# Patient Record
Sex: Female | Born: 1950 | Race: White | Hispanic: No | Marital: Married | Smoking: Former smoker
Health system: Southern US, Community
[De-identification: ages and names within clinical notes are randomized; demographics above are authoritative.]

## PROBLEM LIST (undated history)

## (undated) DIAGNOSIS — Z780 Asymptomatic menopausal state: Secondary | ICD-10-CM

## (undated) DIAGNOSIS — R51 Headache: Secondary | ICD-10-CM

## (undated) DIAGNOSIS — I10 Essential (primary) hypertension: Secondary | ICD-10-CM

## (undated) DIAGNOSIS — E785 Hyperlipidemia, unspecified: Secondary | ICD-10-CM

## (undated) HISTORY — PX: COLONOSCOPY W/ POLYPECTOMY: SHX1380

## (undated) HISTORY — DX: Essential (primary) hypertension: I10

## (undated) HISTORY — DX: Hyperlipidemia, unspecified: E78.5

## (undated) HISTORY — DX: Asymptomatic menopausal state: Z78.0

## (undated) HISTORY — PX: ABDOMINAL HYSTERECTOMY: SHX81

## (undated) HISTORY — PX: TUBAL LIGATION: SHX77

## (undated) HISTORY — DX: Headache: R51

---

## 1998-08-01 ENCOUNTER — Ambulatory Visit (HOSPITAL_COMMUNITY): Admission: RE | Admit: 1998-08-01 | Discharge: 1998-08-01 | Payer: Self-pay | Admitting: Obstetrics & Gynecology

## 1999-08-08 ENCOUNTER — Encounter: Payer: Self-pay | Admitting: Obstetrics & Gynecology

## 1999-08-08 ENCOUNTER — Ambulatory Visit (HOSPITAL_COMMUNITY): Admission: RE | Admit: 1999-08-08 | Discharge: 1999-08-08 | Payer: Self-pay | Admitting: Obstetrics & Gynecology

## 2001-10-25 ENCOUNTER — Other Ambulatory Visit: Admission: RE | Admit: 2001-10-25 | Discharge: 2001-10-25 | Payer: Self-pay | Admitting: Obstetrics & Gynecology

## 2002-11-28 ENCOUNTER — Other Ambulatory Visit: Admission: RE | Admit: 2002-11-28 | Discharge: 2002-11-28 | Payer: Self-pay | Admitting: Obstetrics & Gynecology

## 2003-01-31 ENCOUNTER — Encounter: Payer: Self-pay | Admitting: Internal Medicine

## 2003-01-31 ENCOUNTER — Encounter: Admission: RE | Admit: 2003-01-31 | Discharge: 2003-01-31 | Payer: Self-pay | Admitting: Internal Medicine

## 2003-11-30 ENCOUNTER — Other Ambulatory Visit: Admission: RE | Admit: 2003-11-30 | Discharge: 2003-11-30 | Payer: Self-pay | Admitting: Obstetrics & Gynecology

## 2004-12-24 ENCOUNTER — Other Ambulatory Visit: Admission: RE | Admit: 2004-12-24 | Discharge: 2004-12-24 | Payer: Self-pay | Admitting: Obstetrics & Gynecology

## 2004-12-26 ENCOUNTER — Ambulatory Visit: Payer: Self-pay | Admitting: Internal Medicine

## 2005-01-07 ENCOUNTER — Ambulatory Visit: Payer: Self-pay | Admitting: Internal Medicine

## 2005-03-03 ENCOUNTER — Ambulatory Visit: Payer: Self-pay | Admitting: Internal Medicine

## 2005-05-19 ENCOUNTER — Ambulatory Visit: Payer: Self-pay | Admitting: Internal Medicine

## 2005-07-18 ENCOUNTER — Ambulatory Visit: Payer: Self-pay | Admitting: Internal Medicine

## 2005-07-25 ENCOUNTER — Ambulatory Visit: Payer: Self-pay | Admitting: Internal Medicine

## 2006-01-20 ENCOUNTER — Other Ambulatory Visit: Admission: RE | Admit: 2006-01-20 | Discharge: 2006-01-20 | Payer: Self-pay | Admitting: Obstetrics & Gynecology

## 2006-02-17 ENCOUNTER — Ambulatory Visit: Payer: Self-pay | Admitting: Internal Medicine

## 2006-02-24 ENCOUNTER — Ambulatory Visit: Payer: Self-pay | Admitting: Internal Medicine

## 2006-08-11 ENCOUNTER — Ambulatory Visit: Payer: Self-pay | Admitting: Internal Medicine

## 2006-09-17 ENCOUNTER — Ambulatory Visit: Payer: Self-pay | Admitting: Internal Medicine

## 2006-11-26 ENCOUNTER — Ambulatory Visit: Payer: Self-pay | Admitting: Internal Medicine

## 2006-11-26 LAB — CONVERTED CEMR LAB
ALT: 11 units/L (ref 0–40)
AST: 14 units/L (ref 0–37)
Albumin: 3.9 g/dL (ref 3.5–5.2)
Alkaline Phosphatase: 55 units/L (ref 39–117)
BUN: 16 mg/dL (ref 6–23)
Basophils Absolute: 0 10*3/uL (ref 0.0–0.1)
Basophils Relative: 0.5 % (ref 0.0–1.0)
Bilirubin, Direct: 0.1 mg/dL (ref 0.0–0.3)
CO2: 30 meq/L (ref 19–32)
Calcium: 9.7 mg/dL (ref 8.4–10.5)
Chloride: 102 meq/L (ref 96–112)
Cholesterol: 189 mg/dL (ref 0–200)
Creatinine, Ser: 0.9 mg/dL (ref 0.4–1.2)
Eosinophils Absolute: 0.1 10*3/uL (ref 0.0–0.6)
Eosinophils Relative: 2 % (ref 0.0–5.0)
GFR calc Af Amer: 83 mL/min
GFR calc non Af Amer: 69 mL/min
Glucose, Bld: 106 mg/dL — ABNORMAL HIGH (ref 70–99)
HCT: 38.5 % (ref 36.0–46.0)
HDL: 63.5 mg/dL (ref >39.0)
Hemoglobin: 13.4 g/dL (ref 12.0–15.0)
LDL Cholesterol: 91 mg/dL (ref 0–99)
Lymphocytes Relative: 20.8 % (ref 12.0–46.0)
MCHC: 34.9 g/dL (ref 30.0–36.0)
MCV: 86 fL (ref 78.0–100.0)
Monocytes Absolute: 0.4 10*3/uL (ref 0.2–0.7)
Monocytes Relative: 6.4 % (ref 3.0–11.0)
Neutro Abs: 4.6 10*3/uL (ref 1.4–7.7)
Neutrophils Relative %: 70.3 % (ref 43.0–77.0)
Platelets: 228 10*3/uL (ref 150–400)
Potassium: 4.4 meq/L (ref 3.5–5.1)
RBC: 4.48 M/uL (ref 3.87–5.11)
RDW: 12.2 % (ref 11.5–14.6)
Sodium: 140 meq/L (ref 135–145)
TSH: 1.7 microintl units/mL (ref 0.35–5.50)
Total Bilirubin: 0.9 mg/dL (ref 0.3–1.2)
Total CHOL/HDL Ratio: 3
Total Protein: 6.5 g/dL (ref 6.0–8.3)
Triglycerides: 175 mg/dL — ABNORMAL HIGH (ref 0–149)
VLDL: 35 mg/dL (ref 0–40)
WBC: 6.4 10*3/uL (ref 4.5–10.5)

## 2006-12-03 ENCOUNTER — Ambulatory Visit: Payer: Self-pay | Admitting: Internal Medicine

## 2007-07-23 DIAGNOSIS — I10 Essential (primary) hypertension: Secondary | ICD-10-CM | POA: Insufficient documentation

## 2007-07-23 DIAGNOSIS — E785 Hyperlipidemia, unspecified: Secondary | ICD-10-CM

## 2007-09-20 ENCOUNTER — Ambulatory Visit: Payer: Self-pay | Admitting: Internal Medicine

## 2007-10-04 ENCOUNTER — Encounter: Payer: Self-pay | Admitting: Internal Medicine

## 2007-10-04 ENCOUNTER — Ambulatory Visit: Payer: Self-pay | Admitting: Internal Medicine

## 2007-10-25 ENCOUNTER — Ambulatory Visit: Payer: Self-pay | Admitting: Internal Medicine

## 2007-10-25 DIAGNOSIS — N951 Menopausal and female climacteric states: Secondary | ICD-10-CM

## 2008-01-25 ENCOUNTER — Ambulatory Visit: Payer: Self-pay | Admitting: Internal Medicine

## 2008-01-25 DIAGNOSIS — N3000 Acute cystitis without hematuria: Secondary | ICD-10-CM | POA: Insufficient documentation

## 2008-01-25 LAB — CONVERTED CEMR LAB
Cholesterol, target level: 200 mg/dL
HDL goal, serum: 40 mg/dL
LDL Goal: 160 mg/dL

## 2008-03-13 ENCOUNTER — Ambulatory Visit: Payer: Self-pay | Admitting: Internal Medicine

## 2008-03-13 LAB — CONVERTED CEMR LAB
AST: 16 units/L (ref 0–37)
Albumin: 3.9 g/dL (ref 3.5–5.2)
BUN: 16 mg/dL (ref 6–23)
Basophils Absolute: 0 10*3/uL (ref 0.0–0.1)
Basophils Relative: 0.6 % (ref 0.0–1.0)
Bilirubin Urine: NEGATIVE
Chloride: 102 meq/L (ref 96–112)
Creatinine, Ser: 0.9 mg/dL (ref 0.4–1.2)
Direct LDL: 109.3 mg/dL
Eosinophils Absolute: 0.2 10*3/uL (ref 0.0–0.7)
Eosinophils Relative: 2.8 % (ref 0.0–5.0)
GFR calc Af Amer: 83 mL/min
GFR calc non Af Amer: 69 mL/min
Glucose, Urine, Semiquant: NEGATIVE
HCT: 39.8 % (ref 36.0–46.0)
HDL: 59.6 mg/dL (ref >39.0)
Ketones, urine, test strip: NEGATIVE
MCHC: 34 g/dL (ref 30.0–36.0)
MCV: 86.8 fL (ref 78.0–100.0)
Monocytes Absolute: 0.4 10*3/uL (ref 0.1–1.0)
Neutrophils Relative %: 69.3 % (ref 43.0–77.0)
Nitrite: NEGATIVE
Platelets: 208 10*3/uL (ref 150–400)
Protein, U semiquant: NEGATIVE
RBC: 4.59 M/uL (ref 3.87–5.11)
Specific Gravity, Urine: 1.025
TSH: 1.07 microintl units/mL (ref 0.35–5.50)
Total Bilirubin: 0.7 mg/dL (ref 0.3–1.2)
Triglycerides: 144 mg/dL (ref 0–149)
Urobilinogen, UA: 0.2
WBC Urine, dipstick: NEGATIVE
pH: 5

## 2008-04-13 ENCOUNTER — Ambulatory Visit: Payer: Self-pay | Admitting: Internal Medicine

## 2008-04-13 LAB — CONVERTED CEMR LAB: LDL Goal: 130 mg/dL

## 2008-05-18 ENCOUNTER — Telehealth: Payer: Self-pay | Admitting: Internal Medicine

## 2008-07-17 ENCOUNTER — Ambulatory Visit: Payer: Self-pay | Admitting: Internal Medicine

## 2008-09-18 ENCOUNTER — Ambulatory Visit: Payer: Self-pay | Admitting: Internal Medicine

## 2008-09-21 ENCOUNTER — Encounter: Payer: Self-pay | Admitting: Internal Medicine

## 2008-09-26 ENCOUNTER — Telehealth: Payer: Self-pay | Admitting: *Deleted

## 2008-11-09 ENCOUNTER — Ambulatory Visit: Payer: Self-pay | Admitting: Internal Medicine

## 2008-11-09 DIAGNOSIS — R519 Headache, unspecified: Secondary | ICD-10-CM | POA: Insufficient documentation

## 2008-11-09 DIAGNOSIS — R51 Headache: Secondary | ICD-10-CM

## 2008-12-06 ENCOUNTER — Ambulatory Visit: Payer: Self-pay | Admitting: Internal Medicine

## 2008-12-08 ENCOUNTER — Ambulatory Visit: Payer: Self-pay | Admitting: Internal Medicine

## 2009-04-05 ENCOUNTER — Encounter: Payer: Self-pay | Admitting: Internal Medicine

## 2009-06-04 ENCOUNTER — Ambulatory Visit: Payer: Self-pay | Admitting: Internal Medicine

## 2009-08-16 ENCOUNTER — Ambulatory Visit: Payer: Self-pay | Admitting: Family Medicine

## 2009-08-16 DIAGNOSIS — M79609 Pain in unspecified limb: Secondary | ICD-10-CM | POA: Insufficient documentation

## 2010-04-02 ENCOUNTER — Ambulatory Visit: Payer: Self-pay | Admitting: Internal Medicine

## 2010-04-02 LAB — CONVERTED CEMR LAB
ALT: 11 units/L (ref 0–35)
BUN: 22 mg/dL (ref 6–23)
Basophils Relative: 0.5 % (ref 0.0–3.0)
Bilirubin Urine: NEGATIVE
CO2: 29 meq/L (ref 19–32)
Chloride: 103 meq/L (ref 96–112)
Cholesterol: 203 mg/dL — ABNORMAL HIGH (ref 0–200)
Creatinine, Ser: 0.8 mg/dL (ref 0.4–1.2)
Direct LDL: 113.6 mg/dL
Eosinophils Absolute: 0.2 10*3/uL (ref 0.0–0.7)
Eosinophils Relative: 2.6 % (ref 0.0–5.0)
Glucose, Bld: 103 mg/dL — ABNORMAL HIGH (ref 70–99)
Glucose, Urine, Semiquant: NEGATIVE
HCT: 38.8 % (ref 36.0–46.0)
Lymphs Abs: 1.4 10*3/uL (ref 0.7–4.0)
MCHC: 33.8 g/dL (ref 30.0–36.0)
MCV: 86.9 fL (ref 78.0–100.0)
Monocytes Absolute: 0.4 10*3/uL (ref 0.1–1.0)
Platelets: 223 10*3/uL (ref 150.0–400.0)
Potassium: 4.7 meq/L (ref 3.5–5.1)
Specific Gravity, Urine: >=1.03
TSH: 1.15 microintl units/mL (ref 0.35–5.50)
Total Bilirubin: 0.4 mg/dL (ref 0.3–1.2)
Total Protein: 6.6 g/dL (ref 6.0–8.3)
VLDL: 37.8 mg/dL (ref 0.0–40.0)
WBC Urine, dipstick: NEGATIVE
WBC: 7.2 10*3/uL (ref 4.5–10.5)
pH: 5.5

## 2010-04-10 ENCOUNTER — Ambulatory Visit: Payer: Self-pay | Admitting: Internal Medicine

## 2010-07-01 ENCOUNTER — Encounter: Admission: RE | Admit: 2010-07-01 | Discharge: 2010-07-01 | Payer: Self-pay | Admitting: Obstetrics & Gynecology

## 2010-07-10 ENCOUNTER — Ambulatory Visit: Payer: Self-pay | Admitting: Internal Medicine

## 2010-10-30 ENCOUNTER — Ambulatory Visit: Admit: 2010-10-30 | Payer: Self-pay | Admitting: Internal Medicine

## 2010-11-10 LAB — CONVERTED CEMR LAB: Pap Smear: NORMAL

## 2010-11-14 NOTE — Assessment & Plan Note (Signed)
Summary: cpx/cjr/pt rsc/cjr pt rsc/njr   Vital Signs:  Patient profile:   60 year old female Height:      66 inches Weight:      246 pounds BMI:     39.85 Temp:     98.2 degrees F oral Pulse rate:   68 / minute Resp:     14 per minute BP sitting:   130 / 80  (left arm) Cuff size:   large  Vitals Entered By: Willy Eddy, LPN (April 10, 2010 3:03 PM) CC: cpx, Hypertension Management Is Patient Diabetic? No   Primary Care Provider:  Stacie Glaze MD  CC:  cpx and Hypertension Management.  History of Present Illness: The pt was asked about all immunizations, health maint. services that are appropriate to their age and was given guidance on diet exercize  and weight management  not exercizing life work balance weight gain and HTN moderately controll   Hypertension History:      She denies headache, chest pain, palpitations, dyspnea with exertion, orthopnea, PND, peripheral edema, visual symptoms, neurologic problems, syncope, and side effects from treatment.        Positive major cardiovascular risk factors include female age 65 years old or older, hyperlipidemia, and hypertension.  Negative major cardiovascular risk factors include negative family history for ischemic heart disease and non-tobacco-user status.     Preventive Screening-Counseling & Management  Alcohol-Tobacco     Smoking Status: quit     Passive Smoke Exposure: no  Problems Prior to Update: 1)  Foot Pain  (ICD-729.5) 2)  Headache  (ICD-784.0) 3)  Physical Examination, Normal  (ICD-V70.0) 4)  Acute Cystitis  (ICD-595.0) 5)  Morbid Obesity  (ICD-278.01) 6)  Symptomatic Menopausal/female Climacteric States  (ICD-627.2) 7)  Hypertension  (ICD-401.9) 8)  Hyperlipidemia  (ICD-272.4)  Current Problems (verified): 1)  Foot Pain  (ICD-729.5) 2)  Headache  (ICD-784.0) 3)  Physical Examination, Normal  (ICD-V70.0) 4)  Acute Cystitis  (ICD-595.0) 5)  Morbid Obesity  (ICD-278.01) 6)  Symptomatic  Menopausal/female Climacteric States  (ICD-627.2) 7)  Hypertension  (ICD-401.9) 8)  Hyperlipidemia  (ICD-272.4)  Medications Prior to Update: 1)  Toprol Xl 25 Mg  Tb24 (Metoprolol Succinate) .... Take 1 Tablet By Mouth Once A Day 2)  Premarin 1.25 Mg  Tabs (Estrogens Conjugated) .... Take 1 Tablet By Mouth Once A Day 3)  Aspirin 81 Mg  Tbec (Aspirin) .... Once Daily 4)  Hyzaar 100-25 Mg  Tabs (Losartan Potassium-Hctz) .... Once Daily 5)  Pristiq 100 Mg Xr24h-Tab (Desvenlafaxine Succinate) .... One By Mouth Daily 6)  Phentermine Hcl 37.5 Mg  Tabs (Phentermine Hcl) .... On Hold 7)  Ambien 10 Mg  Tabs (Zolpidem Tartrate) .... One By Mouth Daily 8)  Fish Oil Concentrate 1000 Mg  Caps (Omega-3 Fatty Acids) .... One By Mouth Bid 9)  Oscal 500/200 D-3 500-200 Mg-Unit Tabs (Calcium-Vitamin D) .Marland Kitchen.. 1 Once Daily 10)  Diclofenac Sodium 75 Mg Tbec (Diclofenac Sodium) .... One Twice Daily As Needed 11)  Clotrimazole-Betamethasone 1-0.05 % Crea (Clotrimazole-Betamethasone) .... Apply To Ear Rash Two Times A Day  Current Medications (verified): 1)  Toprol Xl 25 Mg  Tb24 (Metoprolol Succinate) .... Take 1 Tablet By Mouth Once A Day 2)  Premarin 1.25 Mg  Tabs (Estrogens Conjugated) .... Take 1 Tablet By Mouth Once A Day 3)  Aspirin 81 Mg  Tbec (Aspirin) .... Once Daily 4)  Hyzaar 100-25 Mg  Tabs (Losartan Potassium-Hctz) .... Once Daily 5)  Pristiq 100 Mg  Xr24h-Tab (Desvenlafaxine Succinate) .... One By Mouth Daily 6)  Phentermine Hcl 37.5 Mg  Tabs (Phentermine Hcl) .... On Hold 7)  Ambien 10 Mg  Tabs (Zolpidem Tartrate) .... One By Mouth Daily 8)  Fish Oil Concentrate 1000 Mg  Caps (Omega-3 Fatty Acids) .... One By Mouth Bid 9)  Oscal 500/200 D-3 500-200 Mg-Unit Tabs (Calcium-Vitamin D) .Marland Kitchen.. 1 Once Daily  Allergies (verified): 1)  ! Thea Gist  Past History:  Family History: Last updated: 04/13/2008 Family History Hypertension father Family History of Prostate CA 1st degree relative <50 Family  History Ovarian cancer  grandmother mother with dementia  Social History: Last updated: 10/25/2007 Married Former Smoker Drug use-no Regular exercise-no  Risk Factors: Exercise: no (10/25/2007)  Risk Factors: Smoking Status: quit (04/10/2010) Passive Smoke Exposure: no (04/10/2010)  Past medical, surgical, family and social histories (including risk factors) reviewed, and no changes noted (except as noted below).  Past Medical History: Reviewed history from 12/06/2008 and no changes required. Hyperlipidemia Hypertension menopause Headache  Past Surgical History: Reviewed history from 10/25/2007 and no changes required. Colon polypectomy Hysterectomy Tubal ligation  Family History: Reviewed history from 04/13/2008 and no changes required. Family History Hypertension father Family History of Prostate CA 1st degree relative <50 Family History Ovarian cancer  grandmother mother with dementia  Social History: Reviewed history from 10/25/2007 and no changes required. Married Former Smoker Drug use-no Regular exercise-no  Review of Systems  The patient denies anorexia, fever, weight loss, weight gain, vision loss, decreased hearing, hoarseness, chest pain, syncope, dyspnea on exertion, peripheral edema, prolonged cough, headaches, hemoptysis, abdominal pain, melena, hematochezia, severe indigestion/heartburn, hematuria, incontinence, genital sores, muscle weakness, suspicious skin lesions, transient blindness, difficulty walking, depression, unusual weight change, abnormal bleeding, enlarged lymph nodes, angioedema, and breast masses.    Physical Exam  General:  Well-developed,well-nourished,in no acute distress; alert,appropriate and cooperative throughout examination Head:  Normocephalic and atraumatic without obvious abnormalities. No apparent alopecia or balding. Eyes:  No corneal or conjunctival inflammation noted. EOMI. Perrla. Funduscopic exam benign, without  hemorrhages, exudates or papilledema. Vision grossly normal. Ears:  R ear normal and L ear normal.   Nose:  no external deformity and no nasal discharge.   Mouth:  good dentition and pharynx pink and moist.   Neck:  No deformities, masses, or tenderness noted. Lungs:  Normal respiratory effort, chest expands symmetrically. Lungs are clear to auscultation, no crackles or wheezes. Heart:  Normal rate and regular rhythm. S1 and S2 normal without gallop, murmur, click, rub or other extra sounds. Abdomen:  soft and non-tender.   Msk:  normal ROM, no joint swelling, no joint warmth, no redness over joints, joint tenderness, and enlarged MCP joints.   Extremities:  feet are symmetric in appearance. No erythema, warmth, or edema involving the right foot. She has no localized bony tenderness. Full range of motion ankle. Normal distal foot pulses. Neurologic:  patient has impairment of touch dorsum right foot mostly involving the first second and third toes. This extends about midway up the foot. No weakness with dorsiflexion or plantarflexion.   Impression & Recommendations:  Problem # 1:  PHYSICAL EXAMINATION, NORMAL (ICD-V70.0) The pt was asked about all immunizations, health maint. services that are appropriate to their age and was given guidance on diet exercize  and weight management  Mammogram: abnormal (07/12/2009) Pap smear: normal (07/12/2009) Colonoscopy: Adenomatous Polyp (10/04/2007) Td Booster: Tdap (10/13/2005)   Flu Vax: Fluvax 3+ (07/17/2008)   Chol: 203 (04/02/2010)   HDL: 73.80 (04/02/2010)  LDL: DEL (03/13/2008)   TG: 189.0 (04/02/2010) TSH: 1.15 (04/02/2010)   Next mammogram due:: 07/2010 (04/10/2010)  Discussed using sunscreen, use of alcohol, drug use, self breast exam, routine dental care, routine eye care, schedule for GYN exam, routine physical exam, seat belts, multiple vitamins, osteoporosis prevention, adequate calcium intake in diet, recommendations for immunizations,  mammograms and Pap smears.  Discussed exercise and checking cholesterol.  Discussed gun safety, safe sex, and contraception.  Problem # 2:  HYPERTENSION (ICD-401.9)  Her updated medication list for this problem includes:    Toprol Xl 25 Mg Tb24 (Metoprolol succinate) .Marland Kitchen... Take 1 tablet by mouth once a day    Hyzaar 100-25 Mg Tabs (Losartan potassium-hctz) ..... Once daily  Orders: EKG w/ Interpretation (93000)  BP today: 130/80 Prior BP: 144/76 (08/16/2009)  Prior 10 Yr Risk Heart Disease: Not enough information (06/04/2009)  Labs Reviewed: K+: 4.7 (04/02/2010) Creat: : 0.8 (04/02/2010)   Chol: 203 (04/02/2010)   HDL: 73.80 (04/02/2010)   LDL: DEL (03/13/2008)   TG: 189.0 (04/02/2010)  Problem # 3:  MORBID OBESITY (ICD-278.01)  Ht: 66 (04/10/2010)   Wt: 246 (04/10/2010)   BMI: 39.85 (04/10/2010)  Problem # 4:  HYPERLIPIDEMIA (ICD-272.4)  Labs Reviewed: SGOT: 13 (04/02/2010)   SGPT: 11 (04/02/2010)  Lipid Goals: Chol Goal: 200 (01/25/2008)   HDL Goal: 40 (01/25/2008)   LDL Goal: 130 (04/13/2008)   TG Goal: 150 (01/25/2008)  Prior 10 Yr Risk Heart Disease: Not enough information (06/04/2009)   HDL:73.80 (04/02/2010), 59.6 (03/13/2008)  LDL:DEL (03/13/2008), 91 (54/06/8118)  Chol:203 (04/02/2010), 205 (03/13/2008)  Trig:189.0 (04/02/2010), 144 (03/13/2008)  Complete Medication List: 1)  Toprol Xl 25 Mg Tb24 (Metoprolol succinate) .... Take 1 tablet by mouth once a day 2)  Premarin 1.25 Mg Tabs (Estrogens conjugated) .... Take 1 tablet by mouth once a day 3)  Aspirin 81 Mg Tbec (Aspirin) .... Once daily 4)  Hyzaar 100-25 Mg Tabs (Losartan potassium-hctz) .... Once daily 5)  Pristiq 100 Mg Xr24h-tab (Desvenlafaxine succinate) .... One by mouth daily 6)  Phentermine Hcl 37.5 Mg Tabs (Phentermine hcl) .... On hold 7)  Ambien 10 Mg Tabs (Zolpidem tartrate) .... One by mouth daily 8)  Fish Oil Concentrate 1000 Mg Caps (Omega-3 fatty acids) .... One by mouth bid 9)  Oscal 500/200  D-3 500-200 Mg-unit Tabs (Calcium-vitamin d) .Marland Kitchen.. 1 once daily  Hypertension Assessment/Plan:      The patient's hypertensive risk group is category B: At least one risk factor (excluding diabetes) with no target organ damage.  Today's blood pressure is 130/80.  Her blood pressure goal is < 140/90.   Preventive Care Screening  Mammogram:    Date:  07/12/2009    Next Due:  07/2010    Results:  abnormal   Pap Smear:    Date:  07/12/2009    Next Due:  07/2010    Results:  normal

## 2010-11-14 NOTE — Assessment & Plan Note (Signed)
Summary: CPX/NO PAP/NJR PT RESCH/NTA   Vital Signs:  Patient Profile:   60 Years Old Female Height:     66 inches Weight:      235 pounds BMI:     38.07 Temp:     98.2 degrees F oral Pulse rate:   76 / minute Resp:     14 per minute BP sitting:   124 / 72  (left arm)  Vitals Entered By: Willy Eddy, LPN (April 14, 5783 8:41 AM)                 Chief Complaint:  cpx- adacel 2007-colon 2008-gyn check up yearly with dr Ferne Reus neal.  History of Present Illness:  Follow-Up Visit      This is a 60 year old woman who presents for Follow-up visit.  The patient denies chest pain, palpitations, dizziness, syncope, low blood sugar symptoms, high blood sugar symptoms, edema, SOB, DOE, PND, and orthopnea.  Since the last visit the patient notes no new problems or concerns.  The patient reports not taking meds as prescribed and monitoring BP.  When questioned about possible medication side effects, the patient notes none, cramping, and muscle aches.    Lipid Management History:      Positive NCEP/ATP III risk factors include female age 53 years old or older and hypertension.  Negative NCEP/ATP III risk factors include no family history for ischemic heart disease and non-tobacco-user status.        The patient states that she knows about the "Therapeutic Lifestyle Change" diet.  Her compliance with the TLC diet is fair.  The patient expresses understanding of adjunctive measures for cholesterol lowering.  Adjunctive measures started by the patient include omega-3 supplements and weight reduction.  She expresses no side effects from her lipid-lowering medication.       Prior Medication List:  TOPROL XL 25 MG  TB24 (METOPROLOL SUCCINATE) Take 1 tablet by mouth once a day PREMARIN 1.25 MG  TABS (ESTROGENS CONJUGATED) Take 1 tablet by mouth once a day ASPIRIN 81 MG  TBEC (ASPIRIN) once daily HYZAAR 100-25 MG  TABS (LOSARTAN POTASSIUM-HCTZ) once daily TRICOR 145 MG  TABS (FENOFIBRATE) NOT  TAKING!!!!!! LEXAPRO 20 MG  TABS (ESCITALOPRAM OXALATE) 0ne by mouth daily ALPRAZOLAM 0.25 MG  TABS (ALPRAZOLAM) one by mouth q 4 hours prn PHENTERMINE HCL 37.5 MG  TABS (PHENTERMINE HCL) one half to one by mouth daily AMBIEN 10 MG  TABS (ZOLPIDEM TARTRATE) one by mouth daily   Current Allergies (reviewed today): ! * DURACEF  Past Medical History:    Reviewed history from 10/25/2007 and no changes required:       Hyperlipidemia       Hypertension       menopause  Past Surgical History:    Reviewed history from 10/25/2007 and no changes required:       Colon polypectomy       Hysterectomy       Tubal ligation   Family History:    Reviewed history from 10/25/2007 and no changes required:       Family History Hypertension       father       Family History of Prostate CA 1st degree relative <50       Family History Ovarian cancer  grandmother       mother with dementia  Social History:    Reviewed history from 10/25/2007 and no changes required:       Married  Former Smoker       Drug use-no       Regular exercise-no    Review of Systems  The patient denies anorexia, fever, weight loss, weight gain, vision loss, decreased hearing, hoarseness, chest pain, syncope, dyspnea on exertion, peripheral edema, prolonged cough, headaches, hemoptysis, abdominal pain, hematochezia, severe indigestion/heartburn, hematuria, incontinence, genital sores, muscle weakness, suspicious skin lesions, transient blindness, difficulty walking, depression, unusual weight change, abnormal bleeding, enlarged lymph nodes, angioedema, and breast masses.     Physical Exam  General:     Well-developed,well-nourished,in no acute distress; alert,appropriate and cooperative throughout examination Head:     normocephalic and atraumatic.   Eyes:     No corneal or conjunctival inflammation noted. EOMI. Perrla. Funduscopic exam benign, without hemorrhages, exudates or papilledema. Vision grossly  normal. Ears:     R ear normal and L ear normal.   Nose:     External nasal examination shows no deformity or inflammation. Nasal mucosa are pink and moist without lesions or exudates. Mouth:     Oral mucosa and oropharynx without lesions or exudates.  Teeth in good repair. Neck:     No deformities, masses, or tenderness noted. Lungs:     Normal respiratory effort, chest expands symmetrically. Lungs are clear to auscultation, no crackles or wheezes. Heart:     Normal rate and regular rhythm. S1 and S2 normal without gallop, murmur, click, rub or other extra sounds. Abdomen:     soft and non-tender.   Msk:     normal ROM, no joint swelling, no joint warmth, no redness over joints, joint tenderness, and enlarged MCP joints.   Pulses:     R and L carotid,radial,femoral,dorsalis pedis and posterior tibial pulses are full and equal bilaterally Extremities:     No clubbing, cyanosis, edema, or deformity noted with normal full range of motion of all joints.   Neurologic:     No cranial nerve deficits noted. Station and gait are normal. Plantar reflexes are down-going bilaterally. DTRs are symmetrical throughout. Sensory, motor and coordinative functions appear intact.    Impression & Recommendations:  Problem # 1:  HYPERLIPIDEMIA (ICD-272.4)  The following medications were removed from the medication list:    Tricor 145 Mg Tabs (Fenofibrate) ..... Not taking!!!!!!  Labs Reviewed: Chol: 205 (03/13/2008)   HDL: 59.6 (03/13/2008)   LDL: DEL (03/13/2008)   TG: 144 (03/13/2008) SGOT: 16 (03/13/2008)   SGPT: 14 (03/13/2008)  Lipid Goals: Chol Goal: 200 (01/25/2008)   HDL Goal: 40 (01/25/2008)   LDL Goal: 130 (04/13/2008)   TG Goal: 150 (01/25/2008)  10 Yr Risk Heart Disease: Not enough information Prior 10 Yr Risk Heart Disease: 6 % (10/25/2007)   Problem # 2:  HYPERTENSION (ICD-401.9)  Her updated medication list for this problem includes:    Toprol Xl 25 Mg Tb24 (Metoprolol  succinate) .Marland Kitchen... Take 1 tablet by mouth once a day    Hyzaar 100-25 Mg Tabs (Losartan potassium-hctz) ..... Once daily  BP today: 124/72 Prior BP: 140/90 (01/25/2008)  10 Yr Risk Heart Disease: Not enough information Prior 10 Yr Risk Heart Disease: 6 % (10/25/2007)  Labs Reviewed: Creat: 0.9 (03/13/2008) Chol: 205 (03/13/2008)   HDL: 59.6 (03/13/2008)   LDL: DEL (03/13/2008)   TG: 144 (03/13/2008)   Problem # 3:  MORBID OBESITY (ICD-278.01)  Problem # 4:  PHYSICAL EXAMINATION, NORMAL (ICD-V70.0)  Complete Medication List: 1)  Toprol Xl 25 Mg Tb24 (Metoprolol succinate) .... Take 1 tablet by mouth once a day  2)  Premarin 1.25 Mg Tabs (Estrogens conjugated) .... Take 1 tablet by mouth once a day 3)  Aspirin 81 Mg Tbec (Aspirin) .... Once daily 4)  Hyzaar 100-25 Mg Tabs (Losartan potassium-hctz) .... Once daily 5)  Lexapro 20 Mg Tabs (Escitalopram oxalate) .... 0ne by mouth daily 6)  Alprazolam 0.25 Mg Tabs (Alprazolam) .... One by mouth q 4 hours prn 7)  Phentermine Hcl 37.5 Mg Tabs (Phentermine hcl) .... One half to one by mouth daily 8)  Ambien 10 Mg Tabs (Zolpidem tartrate) .... One by mouth daily 9)  Fish Oil Concentrate 1000 Mg Caps (Omega-3 fatty acids) .... One by mouth bid  Lipid Assessment/Plan:      Based on NCEP/ATP III, the patient's risk factor category is "2 or more risk factors and a calculated 10 year CAD risk of < 20%".  From this information, the patient's calculated lipid goals are as follows: Total cholesterol goal is 200; LDL cholesterol goal is 130; HDL cholesterol goal is 40; Triglyceride goal is 150.     Patient Instructions: 1)  Please schedule a follow-up appointment in 3 months.   Prescriptions: PHENTERMINE HCL 37.5 MG  TABS (PHENTERMINE HCL) one half to one by mouth daily  #30 x 3   Entered and Authorized by:   Stacie Glaze MD   Signed by:   Stacie Glaze MD on 04/13/2008   Method used:   Print then Give to Patient   RxID:    [id number]  ]

## 2010-11-14 NOTE — Assessment & Plan Note (Signed)
Summary: 3 MONTH ROV // RS   Vital Signs:  Patient profile:   60 year old female Height:      66 inches Weight:      242 pounds BMI:     39.20 Temp:     98.2 degrees F oral Pulse rate:   68 / minute Resp:     14 per minute BP sitting:   126 / 80  (left arm)  Vitals Entered By: Willy Eddy, LPN (July 10, 2010 1:26 PM)  Nutrition Counseling: Patient's BMI is greater than 25 and therefore counseled on weight management options. CC: roa, Hypertension Management, Lipid Management Is Patient Diabetic? No   Primary Care Ta[facility] Celani:  Stacie Glaze MD  CC:  roa, Hypertension Management, and Lipid Management.  History of Present Illness: weight loss goals not met and follow up for HTN and hyperlipidemia HA pattern  stable increased stress from care of elderly mother discussion of pristiq use and missed doses and withdrawl   Hypertension History:      She denies headache, chest pain, palpitations, dyspnea with exertion, orthopnea, PND, peripheral edema, visual symptoms, neurologic problems, syncope, and side effects from treatment.        Positive major cardiovascular risk factors include female age 7 years old or older, hyperlipidemia, and hypertension.  Negative major cardiovascular risk factors include negative family history for ischemic heart disease and non-tobacco-user status.        Further assessment for target organ damage reveals no history of ASHD, stroke/TIA, or peripheral vascular disease.    Lipid Management History:      Positive NCEP/ATP III risk factors include female age 71 years old or older and hypertension.  Negative NCEP/ATP III risk factors include no history of early menopause without estrogen hormone replacement, HDL cholesterol greater than 60, no family history for ischemic heart disease, non-tobacco-user status, no ASHD (atherosclerotic heart disease), no prior stroke/TIA, no peripheral vascular disease, and no history of aortic aneurysm.        The  patient states that she does not know about the "Therapeutic Lifestyle Change" diet.  Her compliance with the TLC diet is fair.      Preventive Screening-Counseling & Management  Alcohol-Tobacco     Smoking Status: quit     Passive Smoke Exposure: no     Tobacco Counseling: to remain off tobacco products  Problems Prior to Update: 1)  Foot Pain  (ICD-729.5) 2)  Headache  (ICD-784.0) 3)  Physical Examination, Normal  (ICD-V70.0) 4)  Acute Cystitis  (ICD-595.0) 5)  Morbid Obesity  (ICD-278.01) 6)  Symptomatic Menopausal/female Climacteric States  (ICD-627.2) 7)  Hypertension  (ICD-401.9) 8)  Hyperlipidemia  (ICD-272.4)  Current Problems (verified): 1)  Foot Pain  (ICD-729.5) 2)  Headache  (ICD-784.0) 3)  Physical Examination, Normal  (ICD-V70.0) 4)  Acute Cystitis  (ICD-595.0) 5)  Morbid Obesity  (ICD-278.01) 6)  Symptomatic Menopausal/female Climacteric States  (ICD-627.2) 7)  Hypertension  (ICD-401.9) 8)  Hyperlipidemia  (ICD-272.4)  Medications Prior to Update: 1)  Toprol Xl 25 Mg  Tb24 (Metoprolol Succinate) .... Take 1 Tablet By Mouth Once A Day 2)  Premarin 1.25 Mg  Tabs (Estrogens Conjugated) .... Take 1 Tablet By Mouth Once A Day 3)  Aspirin 81 Mg  Tbec (Aspirin) .... Once Daily 4)  Hyzaar 100-25 Mg  Tabs (Losartan Potassium-Hctz) .... Once Daily 5)  Pristiq 100 Mg Xr24h-Tab (Desvenlafaxine Succinate) .... One By Mouth Daily 6)  Phentermine Hcl 37.5 Mg  Tabs (Phentermine Hcl) .Marland KitchenMarland KitchenMarland Kitchen  On Hold 7)  Ambien 10 Mg  Tabs (Zolpidem Tartrate) .... One By Mouth Daily 8)  Fish Oil Concentrate 1000 Mg  Caps (Omega-3 Fatty Acids) .... One By Mouth Bid 9)  Oscal 500/200 D-3 500-200 Mg-Unit Tabs (Calcium-Vitamin D) .Marland Kitchen.. 1 Once Daily  Current Medications (verified): 1)  Toprol Xl 25 Mg  Tb24 (Metoprolol Succinate) .... Take 1 Tablet By Mouth Once A Day 2)  Premarin 0.9 Mg Tabs (Estrogens Conjugated) .Marland Kitchen.. 1 Two Times A Day 3)  Aspirin 81 Mg  Tbec (Aspirin) .... Once Daily 4)  Hyzaar  100-25 Mg  Tabs (Losartan Potassium-Hctz) .... Once Daily 5)  Pristiq 100 Mg Xr24h-Tab (Desvenlafaxine Succinate) .... One By Mouth Daily 6)  Phentermine Hcl 37.5 Mg  Tabs (Phentermine Hcl) .... On Hold 7)  Ambien 10 Mg  Tabs (Zolpidem Tartrate) .... One By Mouth Daily 8)  Fish Oil Concentrate 1000 Mg  Caps (Omega-3 Fatty Acids) .... One By Mouth Bid 9)  Oscal 500/200 D-3 500-200 Mg-Unit Tabs (Calcium-Vitamin D) .Marland Kitchen.. 1 Once Daily  Allergies (verified): 1)  ! Thea Gist  Past History:  Family History: Last updated: 04/13/2008 Family History Hypertension father Family History of Prostate CA 1st degree relative <50 Family History Ovarian cancer  grandmother mother with dementia  Social History: Last updated: 10/25/2007 Married Former Smoker Drug use-no Regular exercise-no  Risk Factors: Exercise: no (10/25/2007)  Risk Factors: Smoking Status: quit (07/10/2010) Passive Smoke Exposure: no (07/10/2010)  Past medical, surgical, family and social histories (including risk factors) reviewed, and no changes noted (except as noted below).  Past Medical History: Reviewed history from 12/06/2008 and no changes required. Hyperlipidemia Hypertension menopause Headache  Past Surgical History: Reviewed history from 10/25/2007 and no changes required. Colon polypectomy Hysterectomy Tubal ligation  Family History: Reviewed history from 04/13/2008 and no changes required. Family History Hypertension father Family History of Prostate CA 1st degree relative <50 Family History Ovarian cancer  grandmother mother with dementia  Social History: Reviewed history from 10/25/2007 and no changes required. Married Former Smoker Drug use-no Regular exercise-no  Review of Systems  The patient denies anorexia, fever, weight loss, weight gain, vision loss, decreased hearing, hoarseness, chest pain, syncope, dyspnea on exertion, peripheral edema, prolonged cough, headaches, hemoptysis,  abdominal pain, melena, hematochezia, severe indigestion/heartburn, hematuria, incontinence, genital sores, muscle weakness, suspicious skin lesions, transient blindness, difficulty walking, depression, unusual weight change, abnormal bleeding, enlarged lymph nodes, angioedema, and breast masses.    Physical Exam  General:  Well-developed,well-nourished,in no acute distress; alert,appropriate and cooperative throughout examination Head:  Normocephalic and atraumatic without obvious abnormalities. No apparent alopecia or balding. Eyes:  pupils equal and pupils round.   Ears:  R ear normal and L ear normal.   Mouth:  good dentition and pharynx pink and moist.   Neck:  No deformities, masses, or tenderness noted. Lungs:  Normal respiratory effort, chest expands symmetrically. Lungs are clear to auscultation, no crackles or wheezes. Heart:  Normal rate and regular rhythm. S1 and S2 normal without gallop, murmur, click, rub or other extra sounds. Abdomen:  soft and non-tender.   Msk:  normal ROM, no joint swelling, no joint warmth, no redness over joints, joint tenderness, and enlarged MCP joints.     Impression & Recommendations:  Problem # 1:  MORBID OBESITY (ICD-278.01)  Mammogram: abnormal (07/12/2009) Pap smear: normal (07/12/2009) Colonoscopy: Adenomatous Polyp (10/04/2007) Td Booster: Tdap (10/13/2005)   Flu Vax: Fluvax 3+ (07/17/2008)   Chol: 203 (04/02/2010)   HDL: 73.80 (04/02/2010)  LDL: DEL (03/13/2008)   TG: 189.0 (04/02/2010) TSH: 1.15 (04/02/2010)   Next mammogram due:: 07/2010 (04/10/2010)  Discussed using sunscreen, use of alcohol, drug use, self breast exam, routine dental care, routine eye care, schedule for GYN exam, routine physical exam, seat belts, multiple vitamins, osteoporosis prevention, adequate calcium intake in diet, recommendations for immunizations, mammograms and Pap smears.  Discussed exercise and checking cholesterol.  Discussed gun safety, safe sex, and  contraception.  Ht: 66 (07/10/2010)   Wt: 242 (07/10/2010)   BMI: 39.20 (07/10/2010)  Problem # 2:  HYPERTENSION (ICD-401.9)  Her updated medication list for this problem includes:    Toprol Xl 25 Mg Tb24 (Metoprolol succinate) .Marland Kitchen... Take 1 tablet by mouth once a day    Hyzaar 100-25 Mg Tabs (Losartan potassium-hctz) ..... Once daily  BP today: 126/80 Prior BP: 130/80 (04/10/2010)  Prior 10 Yr Risk Heart Disease: Not enough information (06/04/2009)  Labs Reviewed: K+: 4.7 (04/02/2010) Creat: : 0.8 (04/02/2010)   Chol: 203 (04/02/2010)   HDL: 73.80 (04/02/2010)   LDL: DEL (03/13/2008)   TG: 189.0 (04/02/2010)  Problem # 3:  HYPERLIPIDEMIA (ICD-272.4) Assessment: Unchanged  Labs Reviewed: SGOT: 13 (04/02/2010)   SGPT: 11 (04/02/2010)  Lipid Goals: Chol Goal: 200 (01/25/2008)   HDL Goal: 40 (01/25/2008)   LDL Goal: 130 (04/13/2008)   TG Goal: 150 (01/25/2008)  Prior 10 Yr Risk Heart Disease: Not enough information (06/04/2009)   HDL:73.80 (04/02/2010), 59.6 (03/13/2008)  LDL:DEL (03/13/2008), 91 (95/18/8416)  Chol:203 (04/02/2010), 205 (03/13/2008)  Trig:189.0 (04/02/2010), 144 (03/13/2008)  Problem # 4:  SYMPTOMATIC MENOPAUSAL/FEMALE CLIMACTERIC STATES (ICD-627.2)  Her updated medication list for this problem includes:    Premarin 0.9 Mg Tabs (Estrogens conjugated) .Marland Kitchen... 1 two times a day  Discussed treatment options.   Complete Medication List: 1)  Toprol Xl 25 Mg Tb24 (Metoprolol succinate) .... Take 1 tablet by mouth once a day 2)  Premarin 0.9 Mg Tabs (Estrogens conjugated) .Marland Kitchen.. 1 two times a day 3)  Aspirin 81 Mg Tbec (Aspirin) .... Once daily 4)  Hyzaar 100-25 Mg Tabs (Losartan potassium-hctz) .... Once daily 5)  Pristiq 100 Mg Xr24h-tab (Desvenlafaxine succinate) .... One by mouth daily 6)  Phentermine Hcl 37.5 Mg Tabs (Phentermine hcl) .... On hold 7)  Ambien 10 Mg Tabs (Zolpidem tartrate) .... One by mouth daily 8)  Fish Oil Concentrate 1000 Mg Caps (Omega-3 fatty  acids) .... One by mouth bid 9)  Oscal 500/200 D-3 500-200 Mg-unit Tabs (Calcium-vitamin d) .Marland Kitchen.. 1 once daily  Hypertension Assessment/Plan:      The patient's hypertensive risk group is category B: At least one risk factor (excluding diabetes) with no target organ damage.  Today's blood pressure is 126/80.  Her blood pressure goal is < 140/90.  Lipid Assessment/Plan:      Based on NCEP/ATP III, the patient's risk factor category is "0-1 risk factors".  The patient's lipid goals are as follows: Total cholesterol goal is 200; LDL cholesterol goal is 160; HDL cholesterol goal is 40; Triglyceride goal is 150.    Patient Instructions: 1)  Commit to exercize first 30/ day 2)  Please schedule a follow-up appointment in 3 months.

## 2010-12-25 ENCOUNTER — Ambulatory Visit: Payer: Self-pay | Admitting: Internal Medicine

## 2011-03-18 ENCOUNTER — Other Ambulatory Visit: Payer: Self-pay | Admitting: *Deleted

## 2011-06-09 ENCOUNTER — Other Ambulatory Visit: Payer: Self-pay | Admitting: *Deleted

## 2011-06-09 MED ORDER — ZOLPIDEM TARTRATE 10 MG PO TABS: 10.0000 mg | ORAL_TABLET | Freq: Every evening | ORAL | Status: DC | PRN

## 2011-06-09 MED ORDER — LOSARTAN POTASSIUM-HCTZ 100-25 MG PO TABS: 1.0000 | ORAL_TABLET | Freq: Every day | ORAL | Status: DC

## 2011-06-10 ENCOUNTER — Other Ambulatory Visit: Payer: Self-pay | Admitting: *Deleted

## 2011-06-10 MED ORDER — LOSARTAN POTASSIUM-HCTZ 100-25 MG PO TABS: 1.0000 | ORAL_TABLET | Freq: Every day | ORAL | Status: DC

## 2011-10-27 IMAGING — CR DG FOOT COMPLETE 3+V*R*
3 series · 3 of 3 positions shown · non-contrast
Comparison: 12/16/2001

CLINICAL DATA: Foot pain

RIGHT FOOT COMPLETE - 3+ VIEW

[view not recorded (1 of 3)]
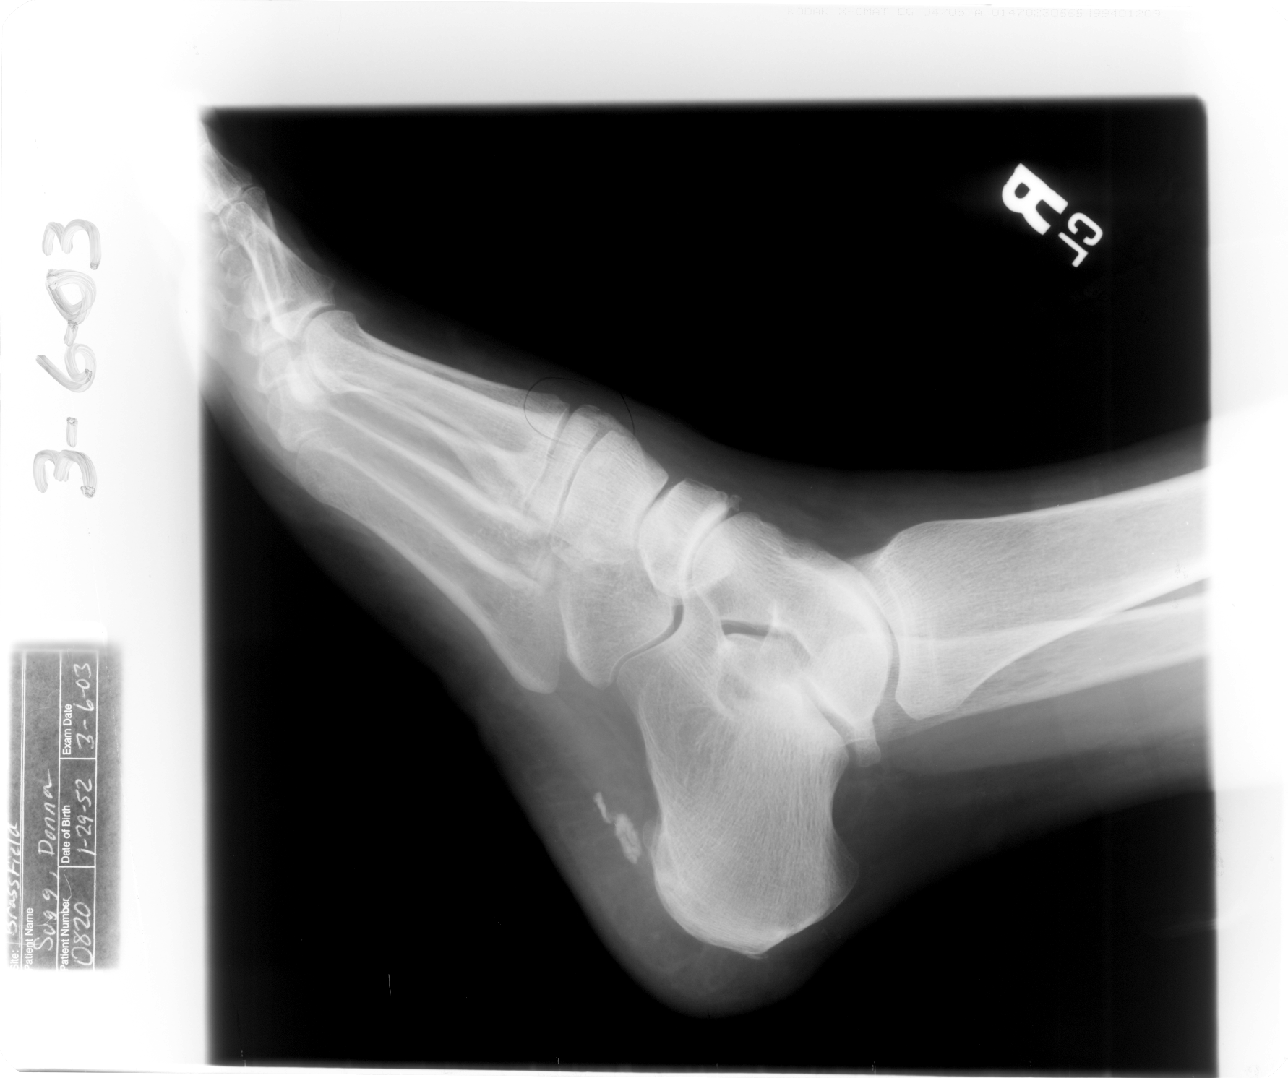

[view not recorded (2 of 3)]
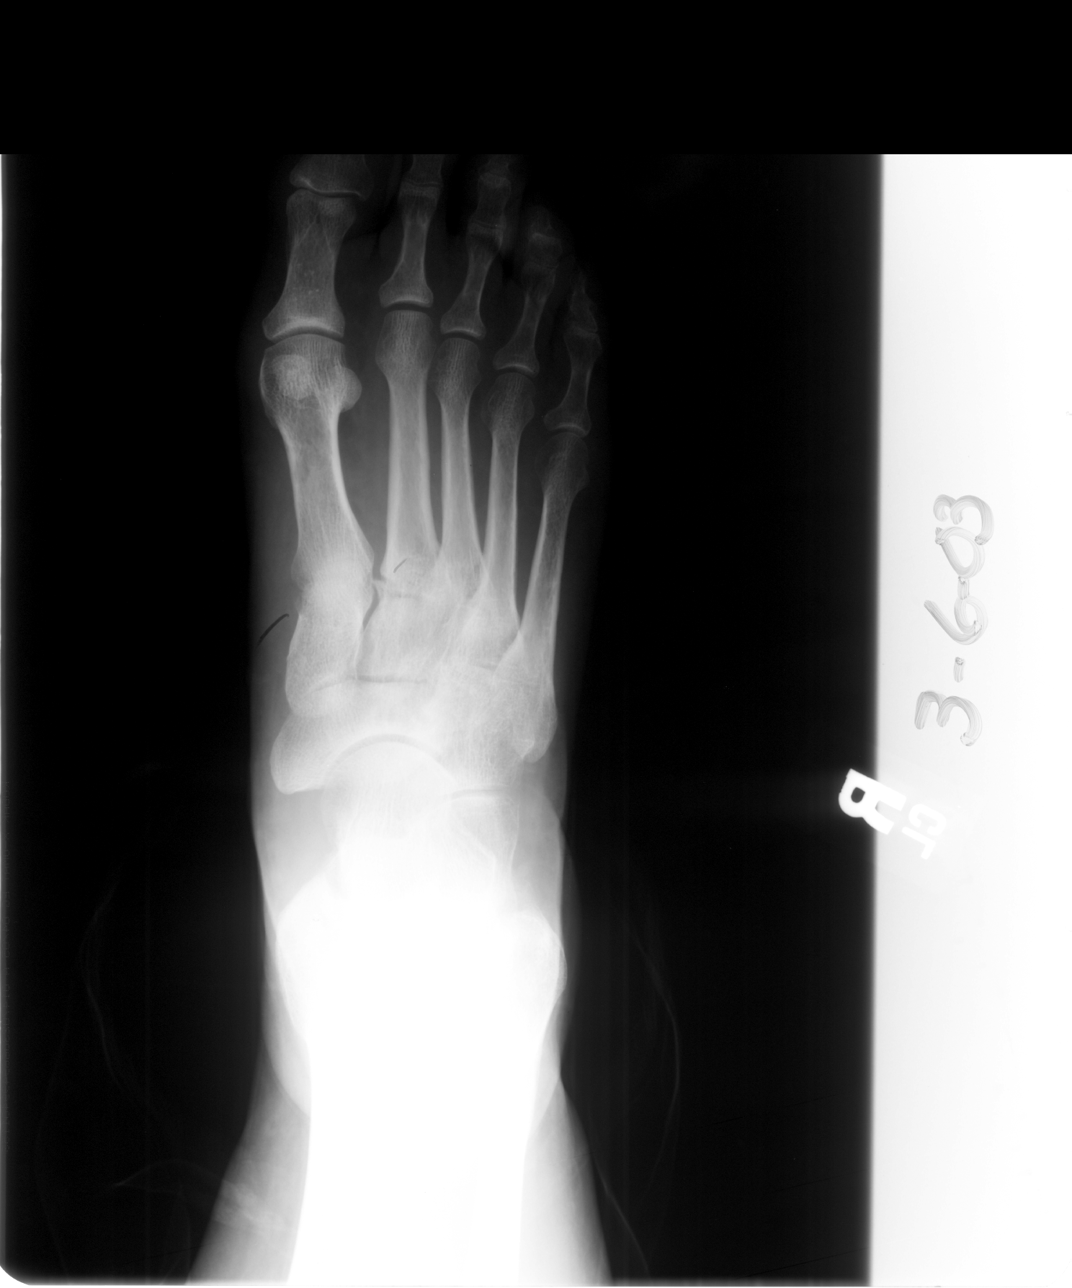

[view not recorded (3 of 3)]
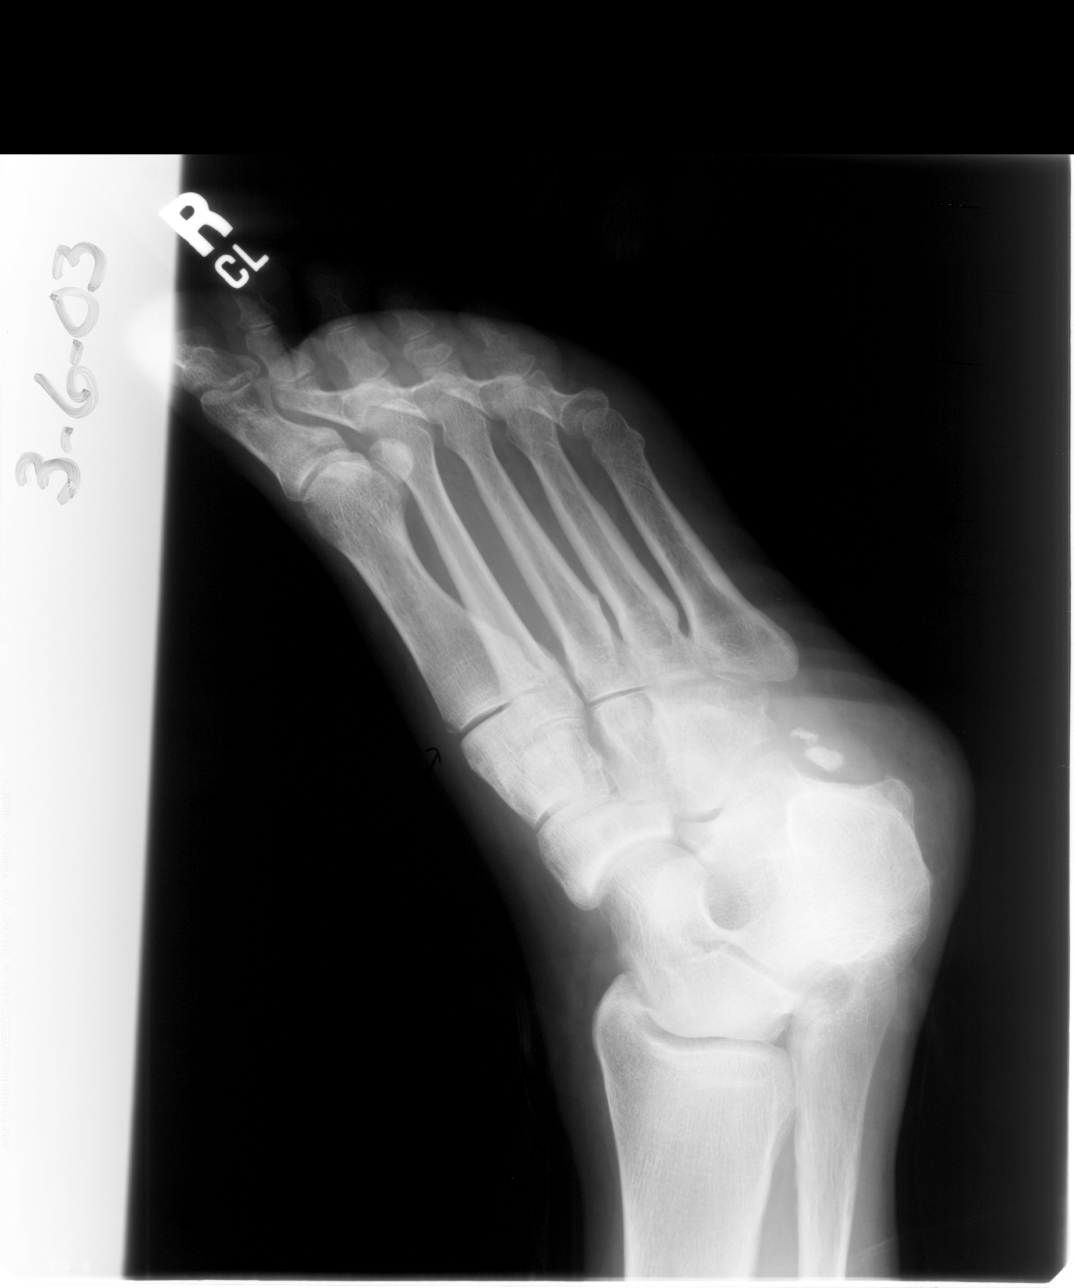

[3 of 3 positions shown; findings below may reference images not displayed]

FINDINGS: No acute bony abnormality.  Again appreciated is a right
plantar calcaneal spur and extensive calcification of the soft
tissues, most likely the plantar fascia/aponeurosis.  Does this
patient have a history of plantar fasciitis?
IMPRESSION: Heel spur.  Probable dystrophic calcification of the plantar
fascia.

## 2011-11-24 ENCOUNTER — Other Ambulatory Visit: Payer: Self-pay | Admitting: *Deleted

## 2011-11-24 MED ORDER — DESVENLAFAXINE SUCCINATE ER 100 MG PO TB24: 100.0000 mg | ORAL_TABLET | Freq: Every day | ORAL | Status: DC

## 2012-02-18 ENCOUNTER — Other Ambulatory Visit: Payer: Self-pay | Admitting: *Deleted

## 2012-02-18 MED ORDER — METOPROLOL SUCCINATE ER 25 MG PO TB24: 25.0000 mg | ORAL_TABLET | Freq: Every day | ORAL | Status: DC

## 2012-04-05 ENCOUNTER — Other Ambulatory Visit: Payer: Self-pay | Admitting: *Deleted

## 2012-04-05 MED ORDER — LOSARTAN POTASSIUM-HCTZ 100-25 MG PO TABS: 1.0000 | ORAL_TABLET | Freq: Every day | ORAL | Status: DC

## 2012-04-05 MED ORDER — METOPROLOL SUCCINATE ER 25 MG PO TB24: 25.0000 mg | ORAL_TABLET | Freq: Every day | ORAL | Status: DC

## 2012-04-06 ENCOUNTER — Other Ambulatory Visit: Payer: Self-pay | Admitting: *Deleted

## 2012-04-06 MED ORDER — METOPROLOL SUCCINATE ER 25 MG PO TB24: 25.0000 mg | ORAL_TABLET | Freq: Every day | ORAL | Status: DC

## 2012-04-06 MED ORDER — LOSARTAN POTASSIUM-HCTZ 100-25 MG PO TABS: 1.0000 | ORAL_TABLET | Freq: Every day | ORAL | Status: DC

## 2012-05-24 ENCOUNTER — Other Ambulatory Visit (INDEPENDENT_AMBULATORY_CARE_PROVIDER_SITE_OTHER): Payer: Managed Care, Other (non HMO)

## 2012-05-24 DIAGNOSIS — Z0000 Encounter for general adult medical examination without abnormal findings: Secondary | ICD-10-CM

## 2012-05-24 LAB — BASIC METABOLIC PANEL
Calcium: 9 mg/dL (ref 8.4–10.5)
GFR: 66.68 mL/min (ref >60.00)
Glucose, Bld: 117 mg/dL — ABNORMAL HIGH (ref 70–99)
Potassium: 3.6 mEq/L (ref 3.5–5.1)
Sodium: 138 mEq/L (ref 135–145)

## 2012-05-24 LAB — HEPATIC FUNCTION PANEL
ALT: 12 U/L (ref 0–35)
AST: 17 U/L (ref 0–37)
Albumin: 3.7 g/dL (ref 3.5–5.2)
Alkaline Phosphatase: 71 U/L (ref 39–117)

## 2012-05-24 LAB — LIPID PANEL
Cholesterol: 196 mg/dL (ref 0–200)
HDL: 72 mg/dL (ref >39.00)
Total CHOL/HDL Ratio: 3
Triglycerides: 207 mg/dL — ABNORMAL HIGH (ref 0.0–149.0)
VLDL: 41.4 mg/dL — ABNORMAL HIGH (ref 0.0–40.0)

## 2012-05-24 LAB — CBC WITH DIFFERENTIAL/PLATELET
Basophils Absolute: 0 10*3/uL (ref 0.0–0.1)
Basophils Relative: 0.3 % (ref 0.0–3.0)
Eosinophils Relative: 2.3 % (ref 0.0–5.0)
HCT: 38.7 % (ref 36.0–46.0)
Hemoglobin: 12.8 g/dL (ref 12.0–15.0)
Lymphocytes Relative: 17 % (ref 12.0–46.0)
Lymphs Abs: 1.1 10*3/uL (ref 0.7–4.0)
Monocytes Relative: 5.3 % (ref 3.0–12.0)
Neutro Abs: 4.7 10*3/uL (ref 1.4–7.7)
RBC: 4.53 Mil/uL (ref 3.87–5.11)
WBC: 6.3 10*3/uL (ref 4.5–10.5)

## 2012-05-24 LAB — POCT URINALYSIS DIPSTICK
Bilirubin, UA: NEGATIVE
Leukocytes, UA: NEGATIVE
Nitrite, UA: NEGATIVE
Protein, UA: NEGATIVE
Urobilinogen, UA: 0.2
pH, UA: 5.5

## 2012-05-28 ENCOUNTER — Encounter: Payer: Self-pay | Admitting: Internal Medicine

## 2012-05-28 ENCOUNTER — Ambulatory Visit (INDEPENDENT_AMBULATORY_CARE_PROVIDER_SITE_OTHER): Payer: Managed Care, Other (non HMO) | Admitting: Internal Medicine

## 2012-05-28 VITALS — BP 140/82 | HR 76 | Temp 98.2°F | Resp 16 | Ht 65.0 in | Wt 246.0 lb

## 2012-05-28 DIAGNOSIS — Z0000 Encounter for general adult medical examination without abnormal findings: Secondary | ICD-10-CM

## 2012-05-28 NOTE — Progress Notes (Signed)
Subjective:    Patient ID: Julia Bradley, female    DOB: 18-May-1951, 61 y.o.   MRN: [id number]  HPI CPX  reviewed DM risks   Review of Systems  Constitutional: Negative for activity change, appetite change and fatigue.  HENT: Negative for ear pain, congestion, neck pain, postnasal drip and sinus pressure.   Eyes: Negative for redness and visual disturbance.  Respiratory: Negative for cough, shortness of breath and wheezing.   Gastrointestinal: Negative for abdominal pain and abdominal distention.  Genitourinary: Negative for dysuria, frequency and menstrual problem.  Musculoskeletal: Negative for myalgias, joint swelling and arthralgias.  Skin: Negative for rash and wound.  Neurological: Negative for dizziness, weakness and headaches.  Hematological: Negative for adenopathy. Does not bruise/bleed easily.  Psychiatric/Behavioral: Negative for disturbed wake/sleep cycle and decreased concentration.   Past Medical History  Diagnosis Date  . Hyperlipidemia   . Hypertension   . Menopause   . Headache     History   Social History  . Marital Status: Married    Spouse Name: N/A    Number of Children: N/A  . Years of Education: N/A   Occupational History  . Not on file.   Social History Main Topics  . Smoking status: Former Games developer  . Smokeless tobacco: Not on file  . Alcohol Use: No  . Drug Use: No  . Sexually Active: Yes   Other Topics Concern  . Not on file   Social History Narrative  . No narrative on file    Past Surgical History  Procedure Date  . Colonoscopy w/ polypectomy   . Abdominal hysterectomy   . Tubal ligation     Family History  Problem Relation Age of Onset  . Hypertension    . Ovarian cancer    . Dementia Mother   . Prostate cancer Father     No Known Allergies  Current Outpatient Prescriptions on File Prior to Visit  Medication Sig Dispense Refill  . desvenlafaxine (PRISTIQ) 100 MG 24 hr tablet Take 1 tablet (100 mg total) by mouth  daily.  90 tablet  3  . estrogens, conjugated, (PREMARIN) 0.9 MG tablet Take 0.9 mg by mouth 2 (two) times daily. Take daily for 21 days then do not take for 7 days.      . metoprolol succinate (TOPROL XL) 25 MG 24 hr tablet Take 1 tablet (25 mg total) by mouth daily.  90 tablet  3  . DISCONTD: zolpidem (AMBIEN) 10 MG tablet Take 1 tablet (10 mg total) by mouth at bedtime as needed for sleep.  90 tablet  1    BP 140/82  Pulse 76  Temp 98.2 F (36.8 C)  Resp 16  Ht 5\' 5"  (1.651 m)  Wt 246 lb (111.585 kg)  BMI 40.94 kg/m2       Objective:   Physical Exam  Nursing note and vitals reviewed. Constitutional: She is oriented to person, place, and time. She appears well-developed and well-nourished. No distress.  HENT:  Head: Normocephalic and atraumatic.  Right Ear: External ear normal.  Left Ear: External ear normal.  Nose: Nose normal.  Mouth/Throat: Oropharynx is clear and moist.  Eyes: Conjunctivae and EOM are normal. Pupils are equal, round, and reactive to light.  Neck: Normal range of motion. Neck supple. No JVD present. No tracheal deviation present. No thyromegaly present.  Cardiovascular: Normal rate, regular rhythm, normal heart sounds and intact distal pulses.   No murmur heard. Pulmonary/Chest: Effort normal and breath sounds normal. She  has no wheezes. She exhibits no tenderness.  Abdominal: Soft. Bowel sounds are normal.  Musculoskeletal: Normal range of motion. She exhibits no edema and no tenderness.  Lymphadenopathy:    She has no cervical adenopathy.  Neurological: She is alert and oriented to person, place, and time. She has normal reflexes. No cranial nerve deficit.  Skin: Skin is warm and dry. She is not diaphoretic.  Psychiatric: She has a normal mood and affect. Her behavior is normal.          Assessment & Plan:   Patient presents for yearly preventative medicine examination.   all immunizations and health maintenance protocols were reviewed with the  patient and they are up to date with these protocols.   screening laboratory values were reviewed with the patient including screening of hyperlipidemia PSA renal function and hepatic function.   There medications past medical history social history problem list and allergies were reviewed in detail.   Goals were established with regard to weight loss exercise diet in compliance with medications

## 2012-05-28 NOTE — Patient Instructions (Addendum)
The patient is instructed to continue all medications as prescribed. Schedule followup with check out clerk upon leaving the clinic  

## 2012-12-08 ENCOUNTER — Ambulatory Visit: Payer: Managed Care, Other (non HMO) | Admitting: Internal Medicine

## 2012-12-14 ENCOUNTER — Encounter: Payer: Self-pay | Admitting: Internal Medicine

## 2012-12-14 ENCOUNTER — Ambulatory Visit (INDEPENDENT_AMBULATORY_CARE_PROVIDER_SITE_OTHER): Payer: Managed Care, Other (non HMO) | Admitting: Internal Medicine

## 2012-12-14 VITALS — BP 140/86 | HR 60 | Temp 98.1°F | Resp 16 | Ht 65.0 in | Wt 224.0 lb

## 2012-12-14 DIAGNOSIS — I10 Essential (primary) hypertension: Secondary | ICD-10-CM

## 2012-12-14 NOTE — Progress Notes (Signed)
Subjective:    Patient ID: Julia Bradley, female    DOB: July 03, 1951, 62 y.o.   MRN: [id number]  HPI  Weight loss of 20 lbs Following paleo  Blood pressure is good Has lost weight  Review of Systems  Constitutional: Negative for activity change, appetite change and fatigue.  HENT: Negative for ear pain, congestion, neck pain, postnasal drip and sinus pressure.   Eyes: Negative for redness and visual disturbance.  Respiratory: Negative for cough, shortness of breath and wheezing.   Gastrointestinal: Negative for abdominal pain and abdominal distention.  Genitourinary: Negative for dysuria, frequency and menstrual problem.  Musculoskeletal: Negative for myalgias, joint swelling and arthralgias.  Skin: Negative for rash and wound.  Neurological: Negative for dizziness, weakness and headaches.  Hematological: Negative for adenopathy. Does not bruise/bleed easily.  Psychiatric/Behavioral: Negative for sleep disturbance and decreased concentration.   Past Medical History  Diagnosis Date  . Hyperlipidemia   . Hypertension   . Menopause   . Headache     History   Social History  . Marital Status: Married    Spouse Name: N/A    Number of Children: N/A  . Years of Education: N/A   Occupational History  . Not on file.   Social History Main Topics  . Smoking status: Former Games developer  . Smokeless tobacco: Not on file  . Alcohol Use: No  . Drug Use: No  . Sexually Active: Yes   Other Topics Concern  . Not on file   Social History Narrative  . No narrative on file    Past Surgical History  Procedure Laterality Date  . Colonoscopy w/ polypectomy    . Abdominal hysterectomy    . Tubal ligation      Family History  Problem Relation Age of Onset  . Hypertension    . Ovarian cancer    . Dementia Mother   . Prostate cancer Father     No Known Allergies  Current Outpatient Prescriptions on File Prior to Visit  Medication Sig Dispense Refill  . aspirin 81 MG tablet  Take 81 mg by mouth daily.      Marland Kitchen estrogens, conjugated, (PREMARIN) 0.9 MG tablet Take 0.9 mg by mouth 2 (two) times daily. Take daily for 21 days then do not take for 7 days.      Marland Kitchen losartan-hydrochlorothiazide (HYZAAR) 100-25 MG per tablet Take 1 tablet by mouth daily.      . metoprolol succinate (TOPROL XL) 25 MG 24 hr tablet Take 1 tablet (25 mg total) by mouth daily.  90 tablet  3  . zolpidem (AMBIEN) 10 MG tablet Take 10 mg by mouth at bedtime as needed.      . desvenlafaxine (PRISTIQ) 100 MG 24 hr tablet Take 1 tablet (100 mg total) by mouth daily.  90 tablet  3   No current facility-administered medications on file prior to visit.    BP 140/86  Pulse 60  Temp(Src) 98.1 F (36.7 C)  Resp 16  Ht 5\' 5"  (1.651 m)  Wt 224 lb (101.606 kg)  BMI 37.28 kg/m2       Objective:   Physical Exam  Nursing note and vitals reviewed. Constitutional: She is oriented to person, place, and time. She appears well-developed and well-nourished. No distress.  HENT:  Head: Normocephalic and atraumatic.  Eyes: Conjunctivae and EOM are normal. Pupils are equal, round, and reactive to light.  Neck: Normal range of motion. Neck supple. No JVD present. No tracheal deviation present. No  thyromegaly present.  Cardiovascular: Normal rate, regular rhythm and normal heart sounds.   No murmur heard. Pulmonary/Chest: Effort normal and breath sounds normal. She has no wheezes. She exhibits no tenderness.  Abdominal: Soft. Bowel sounds are normal.  Musculoskeletal: Normal range of motion. She exhibits no edema and no tenderness.  Lymphadenopathy:    She has no cervical adenopathy.  Neurological: She is alert and oriented to person, place, and time. She has normal reflexes. No cranial nerve deficit.  Skin: Skin is warm and dry. She is not diaphoretic.  Psychiatric: She has a normal mood and affect. Her behavior is normal.          Assessment & Plan:  Weight loss on paleo with good result Blood  pressure stable CPX in august Check on colon timing

## 2012-12-20 ENCOUNTER — Other Ambulatory Visit: Payer: Self-pay | Admitting: *Deleted

## 2012-12-20 MED ORDER — DESVENLAFAXINE SUCCINATE ER 100 MG PO TB24: 100.0000 mg | ORAL_TABLET | Freq: Every day | ORAL | Status: DC

## 2013-04-19 ENCOUNTER — Other Ambulatory Visit: Payer: Self-pay | Admitting: Internal Medicine

## 2013-06-20 ENCOUNTER — Other Ambulatory Visit: Payer: Managed Care, Other (non HMO)

## 2013-06-20 ENCOUNTER — Encounter: Payer: Managed Care, Other (non HMO) | Admitting: Internal Medicine

## 2013-09-26 ENCOUNTER — Other Ambulatory Visit: Payer: Managed Care, Other (non HMO)

## 2013-10-17 ENCOUNTER — Encounter: Payer: Managed Care, Other (non HMO) | Admitting: Internal Medicine

## 2013-11-07 ENCOUNTER — Other Ambulatory Visit (INDEPENDENT_AMBULATORY_CARE_PROVIDER_SITE_OTHER): Payer: Managed Care, Other (non HMO)

## 2013-11-07 DIAGNOSIS — Z0000 Encounter for general adult medical examination without abnormal findings: Secondary | ICD-10-CM

## 2013-11-07 LAB — CBC WITH DIFFERENTIAL/PLATELET
BASOS PCT: 0.4 % (ref 0.0–3.0)
Basophils Absolute: 0 10*3/uL (ref 0.0–0.1)
EOS ABS: 0.2 10*3/uL (ref 0.0–0.7)
Eosinophils Relative: 2.5 % (ref 0.0–5.0)
HEMATOCRIT: 40.5 % (ref 36.0–46.0)
HEMOGLOBIN: 13.7 g/dL (ref 12.0–15.0)
LYMPHS ABS: 1.5 10*3/uL (ref 0.7–4.0)
LYMPHS PCT: 20.9 % (ref 12.0–46.0)
MCHC: 33.8 g/dL (ref 30.0–36.0)
MCV: 84.5 fl (ref 78.0–100.0)
MONOS PCT: 5.5 % (ref 3.0–12.0)
Monocytes Absolute: 0.4 10*3/uL (ref 0.1–1.0)
NEUTROS ABS: 4.9 10*3/uL (ref 1.4–7.7)
Neutrophils Relative %: 70.7 % (ref 43.0–77.0)
Platelets: 210 10*3/uL (ref 150.0–400.0)
RBC: 4.8 Mil/uL (ref 3.87–5.11)
RDW: 14.1 % (ref 11.5–14.6)
WBC: 6.9 10*3/uL (ref 4.5–10.5)

## 2013-11-07 LAB — POCT URINALYSIS DIPSTICK
Bilirubin, UA: NEGATIVE
Glucose, UA: NEGATIVE
Ketones, UA: NEGATIVE
Leukocytes, UA: NEGATIVE
Nitrite, UA: NEGATIVE
PROTEIN UA: NEGATIVE
SPEC GRAV UA: 1.02
UROBILINOGEN UA: 0.2
pH, UA: 7.5

## 2013-11-07 LAB — BASIC METABOLIC PANEL
BUN: 18 mg/dL (ref 6–23)
CALCIUM: 8.9 mg/dL (ref 8.4–10.5)
CO2: 30 meq/L (ref 19–32)
Chloride: 104 mEq/L (ref 96–112)
Creatinine, Ser: 0.8 mg/dL (ref 0.4–1.2)
GFR: 80.47 mL/min (ref >60.00)
GLUCOSE: 93 mg/dL (ref 70–99)
POTASSIUM: 4.1 meq/L (ref 3.5–5.1)
SODIUM: 140 meq/L (ref 135–145)

## 2013-11-07 LAB — HEPATIC FUNCTION PANEL
ALBUMIN: 3.7 g/dL (ref 3.5–5.2)
ALK PHOS: 66 U/L (ref 39–117)
ALT: 10 U/L (ref 0–35)
AST: 15 U/L (ref 0–37)
Bilirubin, Direct: 0 mg/dL (ref 0.0–0.3)
TOTAL PROTEIN: 6.8 g/dL (ref 6.0–8.3)
Total Bilirubin: 0.4 mg/dL (ref 0.3–1.2)

## 2013-11-07 LAB — LIPID PANEL
CHOL/HDL RATIO: 3
Cholesterol: 191 mg/dL (ref 0–200)
HDL: 70.7 mg/dL (ref >39.00)
LDL CALC: 90 mg/dL (ref 0–99)
Triglycerides: 153 mg/dL — ABNORMAL HIGH (ref 0.0–149.0)
VLDL: 30.6 mg/dL (ref 0.0–40.0)

## 2013-11-07 LAB — TSH: TSH: 1.6 u[IU]/mL (ref 0.35–5.50)

## 2013-11-11 ENCOUNTER — Ambulatory Visit (INDEPENDENT_AMBULATORY_CARE_PROVIDER_SITE_OTHER): Payer: Managed Care, Other (non HMO) | Admitting: Internal Medicine

## 2013-11-11 ENCOUNTER — Encounter: Payer: Self-pay | Admitting: Internal Medicine

## 2013-11-11 VITALS — BP 140/80 | HR 58 | Temp 98.4°F | Ht 66.0 in | Wt 232.0 lb

## 2013-11-11 DIAGNOSIS — N951 Menopausal and female climacteric states: Secondary | ICD-10-CM

## 2013-11-11 DIAGNOSIS — I10 Essential (primary) hypertension: Secondary | ICD-10-CM

## 2013-11-11 DIAGNOSIS — Z0000 Encounter for general adult medical examination without abnormal findings: Secondary | ICD-10-CM

## 2013-11-11 DIAGNOSIS — E785 Hyperlipidemia, unspecified: Secondary | ICD-10-CM

## 2013-11-11 NOTE — Patient Instructions (Signed)
The patient is instructed to continue all medications as prescribed. Schedule followup with check out clerk upon leaving the clinic  

## 2013-11-11 NOTE — Progress Notes (Signed)
,  Pre visit review using our clinic review tool, if applicable. No additional management support is needed unless otherwise documented below in the visit note.  

## 2013-11-11 NOTE — Progress Notes (Signed)
Subjective:    Patient ID: Julia Bradley, female    DOB: [birth year], 63 y.o.   MRN: [id number]  HPI Is a 63 year old female who presents for her annual examination.  She is followed for hypertension hyperlipidemia and weight management.  She is also treated for symptomatic menopause climacteric state. 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  She uses Ambien when necessary for sleep    Review of Systems  Constitutional: Negative for activity change, appetite change and fatigue.  HENT: Negative for congestion, ear pain, postnasal drip and sinus pressure.   Eyes: Negative for redness and visual disturbance.  Respiratory: Negative for cough, shortness of breath and wheezing.   Gastrointestinal: Negative for abdominal pain and abdominal distention.  Genitourinary: Negative for dysuria, frequency and menstrual problem.  Musculoskeletal: Negative for arthralgias, joint swelling, myalgias and neck pain.  Skin: Negative for rash and wound.  Neurological: Negative for dizziness, weakness and headaches.  Hematological: Negative for adenopathy. Does not bruise/bleed easily.  Psychiatric/Behavioral: Negative for sleep disturbance and decreased concentration.   Past Medical History  Diagnosis Date  . Hyperlipidemia   . Hypertension   . Menopause   . Headache(784.0)     History   Social History  . Marital Status: Married    Spouse Name: N/A    Number of Children: N/A  . Years of Education: N/A   Occupational History  . Not on file.   Social History Main Topics  . Smoking status: Former Games developermoker  . Smokeless tobacco: Not on file  . Alcohol Use: No  . Drug Use: No  . Sexual Activity: Yes   Other Topics Concern  . Not on file   Social History  Narrative  . No narrative on file    Past Surgical History  Procedure Laterality Date  . Colonoscopy w/ polypectomy    . Abdominal hysterectomy    . Tubal ligation      Family History  Problem Relation Age of Onset  . Hypertension    . Ovarian cancer    . Dementia Mother   . Prostate cancer Father     Allergies  Allergen Reactions  . Duricef [Cefadroxil] Hives    Current Outpatient Prescriptions on File Prior to Visit  Medication Sig Dispense Refill  . aspirin 81 MG tablet Take 81 mg by mouth daily.      Marland Kitchen. estrogens, conjugated, (PREMARIN) 0.9 MG tablet Take 0.9 mg by mouth 2 (two) times daily. Take daily for 21 days then do not take for 7 days.      Marland Kitchen. losartan-hydrochlorothiazide (HYZAAR) 100-25 MG per tablet TAKE 1 TABLET BY MOUTH DAILY  90 tablet  2  . metoprolol succinate (TOPROL-XL) 25 MG 24 hr tablet TAKE 1 TABLET BY MOUTH DAILY  90 tablet  2  . zolpidem (AMBIEN) 10 MG tablet Take 10 mg by mouth at bedtime as needed.       No current facility-administered medications on file prior to visit.    BP 140/80  Pulse 58  Temp(Src) 98.4 F (36.9 C) (Oral)  Ht 5\' 6"  (1.676 m)  Wt 232 lb (105.235 kg)  BMI 37.46 kg/m2       Objective:   Physical Exam  Constitutional: She is oriented to person, place, and time. She appears well-developed  and well-nourished. No distress.  HENT:  Head: Normocephalic and atraumatic.  Eyes: Conjunctivae and EOM are normal. Pupils are equal, round, and reactive to light.  Neck: Normal range of motion. Neck supple. No JVD present. No tracheal deviation present. No thyromegaly present.  Cardiovascular: Normal rate, regular rhythm and intact distal pulses.   Murmur heard. Pulmonary/Chest: Effort normal and breath sounds normal. She has no wheezes. She exhibits no tenderness.  Abdominal: Soft. Bowel sounds are normal.  Musculoskeletal: Normal range of motion. She exhibits no edema and no tenderness.  Lymphadenopathy:    She has no cervical  adenopathy.  Neurological: She is alert and oriented to person, place, and time. She has normal reflexes. No cranial nerve deficit.  Skin: Skin is warm and dry. She is not diaphoretic.  Psychiatric: She has a normal mood and affect. Her behavior is normal.          Assessment & Plan:   This is a routine physical examination for this healthy  Female. Reviewed all health maintenance protocols including mammography colonoscopy bone density and reviewed appropriate screening labs. Her immunization history was reviewed as well as her current medications and allergies refills of her chronic medications were given and the plan for yearly health maintenance was discussed all orders and referrals were made as appropriate.  Weight management and discussion of salt and ETOH

## 2013-11-14 ENCOUNTER — Other Ambulatory Visit: Payer: Managed Care, Other (non HMO)

## 2013-11-18 ENCOUNTER — Encounter: Payer: Managed Care, Other (non HMO) | Admitting: Internal Medicine

## 2014-03-10 ENCOUNTER — Other Ambulatory Visit: Payer: Self-pay | Admitting: Internal Medicine

## 2014-03-14 ENCOUNTER — Telehealth: Payer: Self-pay | Admitting: Internal Medicine

## 2014-03-14 MED ORDER — ZOLPIDEM TARTRATE 10 MG PO TABS: 10.0000 mg | ORAL_TABLET | Freq: Every evening | ORAL | Status: DC | PRN

## 2014-03-14 NOTE — Telephone Encounter (Signed)
[facility] - [place], [place] - [address] is requesting re-fill on zolpidem (AMBIEN) 10 MG tablet

## 2014-03-14 NOTE — Telephone Encounter (Signed)
Ok per Dr Lovell Sheehan, rx faxed to [facility]

## 2014-03-15 ENCOUNTER — Other Ambulatory Visit: Payer: Self-pay | Admitting: *Deleted

## 2014-03-15 MED ORDER — ZOLPIDEM TARTRATE 10 MG PO TABS: 10.0000 mg | ORAL_TABLET | Freq: Every evening | ORAL | Status: DC | PRN

## 2014-05-12 ENCOUNTER — Ambulatory Visit: Payer: Managed Care, Other (non HMO) | Admitting: Internal Medicine

## 2014-09-12 ENCOUNTER — Other Ambulatory Visit: Payer: Self-pay

## 2014-09-12 MED ORDER — ZOLPIDEM TARTRATE 10 MG PO TABS: 10.0000 mg | ORAL_TABLET | Freq: Every evening | ORAL | Status: DC | PRN

## 2014-09-12 NOTE — Telephone Encounter (Signed)
Rx request for Toprol XL 25 mg and Hyzaar 100 mg.  Pls advise.

## 2014-09-12 NOTE — Telephone Encounter (Signed)
[facility]. Pls advise.

## 2014-09-13 ENCOUNTER — Telehealth: Payer: Self-pay | Admitting: Internal Medicine

## 2014-09-13 MED ORDER — LOSARTAN POTASSIUM-HCTZ 100-25 MG PO TABS: 1.0000 | ORAL_TABLET | Freq: Every day | ORAL | Status: AC

## 2014-09-13 MED ORDER — METOPROLOL SUCCINATE ER 25 MG PO TB24: 25.0000 mg | ORAL_TABLET | Freq: Every day | ORAL | Status: AC

## 2014-09-13 MED ORDER — ZOLPIDEM TARTRATE 10 MG PO TABS: 10.0000 mg | ORAL_TABLET | Freq: Every evening | ORAL | Status: AC | PRN

## 2014-09-13 NOTE — Telephone Encounter (Signed)
Ok per Dr Lovell SheehanJenkins, rx's sent in electroncially

## 2014-09-13 NOTE — Telephone Encounter (Signed)
The pharmacy [facility] [address] [place].

## 2014-09-13 NOTE — Telephone Encounter (Signed)
Left message for pt to call back Need to know the name of the pharmacy

## 2014-09-13 NOTE — Telephone Encounter (Signed)
I advise pt that she need to establish with another provider. She said [place]. She is asking if you can refill for 30 days and she will [facility]  She need the following refill   Toprol, Losartan, Ambien

## 2015-04-06 ENCOUNTER — Encounter: Payer: Self-pay | Admitting: Internal Medicine

## 2015-06-11 ENCOUNTER — Other Ambulatory Visit: Payer: Self-pay | Admitting: Obstetrics & Gynecology

## 2015-06-12 LAB — CYTOLOGY - PAP

## 2017-10-14 ENCOUNTER — Encounter: Payer: Self-pay | Admitting: Internal Medicine
